# Patient Record
Sex: Male | Born: 1990 | Race: White | Hispanic: No | Marital: Married | Smoking: Current every day smoker
Health system: Southern US, Community
[De-identification: ages and names within clinical notes are randomized; demographics above are authoritative.]

## PROBLEM LIST (undated history)

## (undated) DIAGNOSIS — M25559 Pain in unspecified hip: Secondary | ICD-10-CM

## (undated) DIAGNOSIS — F419 Anxiety disorder, unspecified: Secondary | ICD-10-CM

## (undated) DIAGNOSIS — G43909 Migraine, unspecified, not intractable, without status migrainosus: Secondary | ICD-10-CM

## (undated) DIAGNOSIS — K219 Gastro-esophageal reflux disease without esophagitis: Secondary | ICD-10-CM

## (undated) DIAGNOSIS — M25551 Pain in right hip: Secondary | ICD-10-CM

## (undated) DIAGNOSIS — M1990 Unspecified osteoarthritis, unspecified site: Secondary | ICD-10-CM

## (undated) DIAGNOSIS — G4700 Insomnia, unspecified: Secondary | ICD-10-CM

## (undated) DIAGNOSIS — F32A Depression, unspecified: Secondary | ICD-10-CM

## (undated) DIAGNOSIS — I10 Essential (primary) hypertension: Secondary | ICD-10-CM

## (undated) DIAGNOSIS — F329 Major depressive disorder, single episode, unspecified: Secondary | ICD-10-CM

## (undated) DIAGNOSIS — G8929 Other chronic pain: Secondary | ICD-10-CM

## (undated) HISTORY — DX: Pain in unspecified hip: M25.559

## (undated) HISTORY — DX: Pain in right hip: M25.551

## (undated) HISTORY — DX: Depression, unspecified: F32.A

## (undated) HISTORY — DX: Other chronic pain: G89.29

## (undated) HISTORY — DX: Major depressive disorder, single episode, unspecified: F32.9

---
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
## 2010-02-19 ENCOUNTER — Emergency Department: Payer: Self-pay | Admitting: Emergency Medicine

## 2011-02-19 ENCOUNTER — Emergency Department: Payer: Self-pay | Admitting: *Deleted

## 2011-09-12 ENCOUNTER — Emergency Department: Payer: Self-pay | Admitting: Emergency Medicine

## 2011-09-12 LAB — URINALYSIS, COMPLETE
Bacteria: NONE SEEN
Blood: NEGATIVE
Glucose,UR: NEGATIVE mg/dL (ref 0–75)
Ketone: NEGATIVE
Leukocyte Esterase: NEGATIVE
Nitrite: NEGATIVE
Protein: NEGATIVE
Squamous Epithelial: <1
WBC UR: 3 /HPF (ref 0–5)

## 2013-10-25 ENCOUNTER — Emergency Department: Payer: Self-pay | Admitting: Emergency Medicine

## 2014-12-06 ENCOUNTER — Emergency Department
Admission: EM | Admit: 2014-12-06 | Discharge: 2014-12-06 | Disposition: A | Discharge status: Other Acute Inpatient Hospital | Payer: Self-pay

## 2014-12-06 ENCOUNTER — Encounter: Payer: Self-pay | Admitting: Emergency Medicine

## 2014-12-06 NOTE — ED Notes (Signed)
Pt states he has been suffering from insomnia for the past 6 months and recently has developed a migraine that has lasted about a week. Taken over the counter medications without relief. Pt is alert and talkative at this time with no increased work of breathing or acute distress noted. Pt states he has gone to see a psychiatrist who prescribed him Ambien and it did not help him sleep.

## 2015-06-06 ENCOUNTER — Emergency Department
Admission: EM | Admit: 2015-06-06 | Discharge: 2015-06-06 | Disposition: A | Discharge status: Home / Self Care | Payer: Medicaid Other | Attending: Emergency Medicine | Admitting: Emergency Medicine

## 2015-06-06 ENCOUNTER — Encounter: Payer: Self-pay | Admitting: Urgent Care

## 2015-06-06 DIAGNOSIS — I10 Essential (primary) hypertension: Secondary | ICD-10-CM | POA: Insufficient documentation

## 2015-06-06 DIAGNOSIS — F17200 Nicotine dependence, unspecified, uncomplicated: Secondary | ICD-10-CM | POA: Diagnosis not present

## 2015-06-06 DIAGNOSIS — G43909 Migraine, unspecified, not intractable, without status migrainosus: Secondary | ICD-10-CM | POA: Diagnosis not present

## 2015-06-06 HISTORY — DX: Essential (primary) hypertension: I10

## 2015-06-06 HISTORY — DX: Unspecified osteoarthritis, unspecified site: M19.90

## 2015-06-06 HISTORY — DX: Migraine, unspecified, not intractable, without status migrainosus: G43.909

## 2015-06-06 NOTE — ED Notes (Signed)
Patient presents with c/o a migraine headache x 1.5 week. Patient denies N/V, visual changes, and neck pain. Patient reports that he has been taking IBU  and Percocet 10/325mg  tabs since onset.

## 2015-06-06 NOTE — ED Notes (Signed)
No answer when called from lobby 

## 2015-06-06 NOTE — ED Notes (Signed)
No answer when called several times from lobby, outside, and restroom 

## 2016-06-15 ENCOUNTER — Encounter: Payer: Self-pay | Admitting: Emergency Medicine

## 2016-06-15 ENCOUNTER — Emergency Department
Admission: EM | Admit: 2016-06-15 | Discharge: 2016-06-15 | Disposition: A | Discharge status: Home / Self Care | Payer: Medicaid Other | Attending: Emergency Medicine | Admitting: Emergency Medicine

## 2016-06-15 DIAGNOSIS — H5711 Ocular pain, right eye: Secondary | ICD-10-CM | POA: Diagnosis not present

## 2016-06-15 DIAGNOSIS — I10 Essential (primary) hypertension: Secondary | ICD-10-CM | POA: Insufficient documentation

## 2016-06-15 DIAGNOSIS — R6884 Jaw pain: Secondary | ICD-10-CM | POA: Insufficient documentation

## 2016-06-15 DIAGNOSIS — Z5321 Procedure and treatment not carried out due to patient leaving prior to being seen by health care provider: Secondary | ICD-10-CM | POA: Insufficient documentation

## 2016-06-15 DIAGNOSIS — F17210 Nicotine dependence, cigarettes, uncomplicated: Secondary | ICD-10-CM | POA: Diagnosis not present

## 2016-06-15 HISTORY — DX: Insomnia, unspecified: G47.00

## 2016-06-15 HISTORY — DX: Anxiety disorder, unspecified: F41.9

## 2016-06-15 HISTORY — DX: Gastro-esophageal reflux disease without esophagitis: K21.9

## 2016-06-15 NOTE — ED Triage Notes (Addendum)
Pt says about 6 months ago he was seen at a [facility] for pain behind his right eye radiating down into his right jaw; says he was diagnosed with sinus infection and prescribed antibiotics; took as directed; after 6 weeks pain went away; 4 days ago same pain has returned; pt says he's been just "trying to deal with it" for the last 4 days but here now because his wife made him come; "I fought tooth and nail and finally just said screw it"; pt in no acute distress; talking in complete coherent sentences; did not take any medication for pain because "it ain't gonna help"

## 2016-11-10 ENCOUNTER — Ambulatory Visit: Payer: Medicaid Other | Admitting: Family Medicine

## 2016-11-26 ENCOUNTER — Emergency Department: Payer: Medicaid Other

## 2016-11-26 ENCOUNTER — Emergency Department
Admission: EM | Admit: 2016-11-26 | Discharge: 2016-11-26 | Disposition: A | Discharge status: Home / Self Care | Payer: Medicaid Other | Attending: Emergency Medicine | Admitting: Emergency Medicine

## 2016-11-26 DIAGNOSIS — M5410 Radiculopathy, site unspecified: Secondary | ICD-10-CM

## 2016-11-26 DIAGNOSIS — F17290 Nicotine dependence, other tobacco product, uncomplicated: Secondary | ICD-10-CM | POA: Diagnosis not present

## 2016-11-26 DIAGNOSIS — M5431 Sciatica, right side: Secondary | ICD-10-CM | POA: Diagnosis not present

## 2016-11-26 DIAGNOSIS — I10 Essential (primary) hypertension: Secondary | ICD-10-CM | POA: Insufficient documentation

## 2016-11-26 DIAGNOSIS — M545 Low back pain: Secondary | ICD-10-CM | POA: Diagnosis present

## 2016-11-26 DIAGNOSIS — Z79899 Other long term (current) drug therapy: Secondary | ICD-10-CM | POA: Diagnosis not present

## 2016-11-26 DIAGNOSIS — M549 Dorsalgia, unspecified: Secondary | ICD-10-CM

## 2016-11-26 LAB — COMPREHENSIVE METABOLIC PANEL
ALBUMIN: 4.8 g/dL (ref 3.5–5.0)
ALK PHOS: 72 U/L (ref 38–126)
ALT: 18 U/L (ref 17–63)
ANION GAP: 8 (ref 5–15)
AST: 17 U/L (ref 15–41)
BILIRUBIN TOTAL: 0.7 mg/dL (ref 0.3–1.2)
BUN: 9 mg/dL (ref 6–20)
CALCIUM: 9.4 mg/dL (ref 8.9–10.3)
CO2: 27 mmol/L (ref 22–32)
Chloride: 105 mmol/L (ref 101–111)
Creatinine, Ser: 0.82 mg/dL (ref 0.61–1.24)
GFR calc Af Amer: >60 mL/min (ref >60)
GFR calc non Af Amer: >60 mL/min (ref >60)
GLUCOSE: 87 mg/dL (ref 65–99)
Potassium: 3.8 mmol/L (ref 3.5–5.1)
SODIUM: 140 mmol/L (ref 135–145)
Total Protein: 7.9 g/dL (ref 6.5–8.1)

## 2016-11-26 LAB — CBC WITH DIFFERENTIAL/PLATELET
BASOS PCT: 1 %
Basophils Absolute: 0.1 10*3/uL (ref 0–0.1)
EOS PCT: 2 %
Eosinophils Absolute: 0.1 10*3/uL (ref 0–0.7)
HEMATOCRIT: 44.5 % (ref 40.0–52.0)
Hemoglobin: 15.5 g/dL (ref 13.0–18.0)
LYMPHS PCT: 41 %
Lymphs Abs: 3.2 10*3/uL (ref 1.0–3.6)
MCH: 30.3 pg (ref 26.0–34.0)
MCHC: 34.8 g/dL (ref 32.0–36.0)
MCV: 87.2 fL (ref 80.0–100.0)
MONO ABS: 0.5 10*3/uL (ref 0.2–1.0)
MONOS PCT: 6 %
NEUTROS ABS: 3.9 10*3/uL (ref 1.4–6.5)
Neutrophils Relative %: 50 %
PLATELETS: 279 10*3/uL (ref 150–440)
RBC: 5.11 MIL/uL (ref 4.40–5.90)
RDW: 13.3 % (ref 11.5–14.5)
WBC: 7.8 10*3/uL (ref 3.8–10.6)

## 2016-11-26 MED ORDER — MELOXICAM 15 MG PO TABS: 15.0000 mg | ORAL_TABLET | Freq: Every day | ORAL | 0 refills | Status: AC

## 2016-11-26 MED ORDER — KETOROLAC TROMETHAMINE 30 MG/ML IJ SOLN
30.0000 mg | Freq: Once | INTRAMUSCULAR | Status: AC
  Administered 2016-11-26: 30 mg via INTRAVENOUS
  Filled 2016-11-26: qty 1

## 2016-11-26 MED ORDER — DEXAMETHASONE SODIUM PHOSPHATE 10 MG/ML IJ SOLN
10.0000 mg | Freq: Once | INTRAMUSCULAR | Status: AC
Start: 2016-11-26 — End: 2016-11-26
  Administered 2016-11-26: 10 mg via INTRAVENOUS
  Filled 2016-11-26: qty 1

## 2016-11-26 MED ORDER — SODIUM CHLORIDE 0.9 % IV BOLUS (SEPSIS)
1000.0000 mL | Freq: Once | INTRAVENOUS | Status: AC
  Administered 2016-11-26: 1000 mL via INTRAVENOUS

## 2016-11-26 MED ORDER — MORPHINE SULFATE (PF) 4 MG/ML IV SOLN
4.0000 mg | Freq: Once | INTRAVENOUS | Status: AC
  Administered 2016-11-26: 4 mg via INTRAVENOUS
  Filled 2016-11-26: qty 1

## 2016-11-26 MED ORDER — ORPHENADRINE CITRATE 30 MG/ML IJ SOLN
60.0000 mg | Freq: Once | INTRAMUSCULAR | Status: AC
  Administered 2016-11-26: 60 mg via INTRAVENOUS
  Filled 2016-11-26: qty 2

## 2016-11-26 MED ORDER — ONDANSETRON HCL 4 MG/2ML IJ SOLN
4.0000 mg | Freq: Once | INTRAMUSCULAR | Status: AC
  Administered 2016-11-26: 4 mg via INTRAVENOUS
  Filled 2016-11-26: qty 2

## 2016-11-26 MED ORDER — METHOCARBAMOL 500 MG PO TABS: 500.0000 mg | ORAL_TABLET | Freq: Four times a day (QID) | ORAL | 0 refills | Status: AC

## 2016-11-26 NOTE — ED Notes (Signed)

## 2016-11-26 NOTE — ED Triage Notes (Signed)
Pt reports extreme back pain since yesterday, denies injury. Pt ambulatory to triage with even and steady gait.
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
## 2017-11-23 ENCOUNTER — Other Ambulatory Visit: Payer: Self-pay

## 2017-11-23 DIAGNOSIS — R599 Enlarged lymph nodes, unspecified: Secondary | ICD-10-CM | POA: Insufficient documentation

## 2017-11-23 DIAGNOSIS — Z79899 Other long term (current) drug therapy: Secondary | ICD-10-CM | POA: Diagnosis not present

## 2017-11-23 DIAGNOSIS — M542 Cervicalgia: Secondary | ICD-10-CM | POA: Diagnosis present

## 2017-11-23 DIAGNOSIS — J029 Acute pharyngitis, unspecified: Secondary | ICD-10-CM | POA: Insufficient documentation

## 2017-11-23 DIAGNOSIS — F17290 Nicotine dependence, other tobacco product, uncomplicated: Secondary | ICD-10-CM | POA: Insufficient documentation

## 2017-11-23 DIAGNOSIS — I10 Essential (primary) hypertension: Secondary | ICD-10-CM | POA: Diagnosis not present

## 2017-11-23 NOTE — ED Triage Notes (Signed)
Pt arrives to ED via POV from home with c/o left-sided neck pain. Pt reports having a "cyst" on his neck for "years" that recently starting getting painful. No c/o N/V/D, no fever, no SHOB or ABD pain.

## 2017-11-24 ENCOUNTER — Emergency Department
Admission: EM | Admit: 2017-11-24 | Discharge: 2017-11-24 | Disposition: A | Discharge status: Home / Self Care | Payer: Medicaid Other | Attending: Emergency Medicine | Admitting: Emergency Medicine

## 2017-11-24 DIAGNOSIS — J029 Acute pharyngitis, unspecified: Secondary | ICD-10-CM

## 2017-11-24 DIAGNOSIS — R599 Enlarged lymph nodes, unspecified: Secondary | ICD-10-CM

## 2017-11-24 LAB — GROUP A STREP BY PCR: Group A Strep by PCR: NOT DETECTED

## 2017-11-24 MED ORDER — AZITHROMYCIN 250 MG PO TABS: 250.0000 mg | ORAL_TABLET | Freq: Every day | ORAL | 0 refills | Status: AC

## 2017-11-24 MED ORDER — AZITHROMYCIN 500 MG PO TABS
500.0000 mg | ORAL_TABLET | Freq: Once | ORAL | Status: AC
  Administered 2017-11-24: 500 mg via ORAL
  Filled 2017-11-24: qty 1

## 2017-11-24 NOTE — ED Provider Notes (Signed)
[facility] Emergency Department Provider Note   ____________________________________________   First MD Initiated Contact with Patient 11/24/17 0102     (approximate)  I have reviewed the triage vital signs and the nursing notes.   HISTORY  Chief Complaint Neck Pain    HPI Charles Brown is a 27 y.o. male who presents to the ED from home with a chief complaint of left-sided neck pain.  Patient states he has had a "cyst" on his left upper neck for years which will swell and decrease in size.  It has gotten painful this week.  Coincidentally he is complaining of a sore throat.  Denies associated fever, chills, chest pain, shortness of breath, abdominal pain, nausea or vomiting.  Denies recent travel or trauma.   Past Medical History:  Diagnosis Date  . Anxiety   . Arthritis   . Chronic pain   . Depression   . GERD (gastroesophageal reflux disease)   . Hip pain, chronic    bilateral  . Hip pain, right   . Hypertension   . Insomnia   . Migraine     There are no active problems to display for this patient.   History reviewed. No pertinent surgical history.  Prior to Admission medications   Medication Sig Start Date End Date Taking? Authorizing Provider  alprazolam Prudy Feeler) 2 MG tablet Take 2 mg by mouth at bedtime.    [provider]  azithromycin (ZITHROMAX) 250 MG tablet Take 1 tablet (250 mg total) by mouth daily. 11/24/17   Irean Hong, MD  esomeprazole (NEXIUM) 20 MG capsule Take 20 mg by mouth daily at 12 noon.    [provider]  meloxicam (MOBIC) 15 MG tablet Take 1 tablet (15 mg total) by mouth daily. 11/26/16   Cuthriell, Delorise Royals, PA-C  methocarbamol (ROBAXIN) 500 MG tablet Take 1 tablet (500 mg total) by mouth 4 (four) times daily. 11/26/16   Cuthriell, Delorise Royals, PA-C  propranolol (INDERAL) 10 MG tablet Take 10 mg by mouth daily.    [provider]    Allergies Penicillins  Family History  Problem  Relation Age of Onset  . Hyperlipidemia Father   . Hyperlipidemia Paternal Grandmother   . Diabetes Paternal Grandmother   . Cancer Paternal Grandmother        lung    Social History Social History   Tobacco Use  . Smoking status: Current Every Day Smoker    Types: E-cigarettes  . Smokeless tobacco: Former Engineer, water Use Topics  . Alcohol use: No  . Drug use: No    Review of Systems  Constitutional: No fever/chills Eyes: No visual changes. ENT: No sore throat. Cardiovascular: Denies chest pain. Respiratory: Denies shortness of breath. Gastrointestinal: No abdominal pain.  No nausea, no vomiting.  No diarrhea.  No constipation. Genitourinary: Negative for dysuria. Musculoskeletal: Positive for painful cyst on left neck.  Negative for back pain. Skin: Negative for rash. Neurological: Negative for headaches, focal weakness or numbness.   ____________________________________________   PHYSICAL EXAM:  VITAL SIGNS: ED Triage Vitals  Enc Vitals Group     BP 11/23/17 2337 127/74     Pulse Rate 11/23/17 2337 (!) 101     Resp 11/23/17 2337 18     Temp 11/23/17 2337 98.4 F (36.9 C)     Temp Source 11/23/17 2337 Oral     SpO2 11/23/17 2337 98 %     Weight 11/23/17 2335 180 lb (81.6 kg)  Height 11/23/17 2335 5\' 11"  (1.803 m)     Head Circumference --      Peak Flow --      Pain Score 11/23/17 2335 8     Pain Loc --      Pain Edu? --      Excl. in GC? --     Constitutional: Alert and oriented. Well appearing and in no acute distress. Eyes: Conjunctivae are normal. PERRL. EOMI. Head: Atraumatic. Ears: Left TM dullness. Nose: No congestion/rhinnorhea. Mouth/Throat: Mucous membranes are moist.  Oropharynx erythematous without tonsillar swelling, exudates or peritonsillar abscess.  There is no hoarse or muffled voice.  There is no drooling. Neck: No stridor.  Tiny freely mobile nodule left upper neck within the hairline which is tender to palpation.  There is  no surrounding erythema, warmth or fluctuance. Cardiovascular: Normal rate, regular rhythm. Grossly normal heart sounds.  Good peripheral circulation. Respiratory: Normal respiratory effort.  No retractions. Lungs CTAB. Gastrointestinal: Soft and nontender. No distention. No abdominal bruits. No CVA tenderness. Musculoskeletal: No lower extremity tenderness nor edema.  No joint effusions. Neurologic:  Normal speech and language. No gross focal neurologic deficits are appreciated. No gait instability. Skin:  Skin is warm, dry and intact. No rash noted. Psychiatric: Mood and affect are normal. Speech and behavior are normal.  ____________________________________________   LABS (all labs ordered are listed, but only abnormal results are displayed)  Labs Reviewed  GROUP A STREP BY PCR   ____________________________________________  EKG  None ____________________________________________  RADIOLOGY  ED MD interpretation: None  Official radiology report(s): No results found.  ____________________________________________   PROCEDURES  Procedure(s) performed: None  Procedures  Critical Care performed: No  ____________________________________________   INITIAL IMPRESSION / ASSESSMENT AND PLAN / ED COURSE  As part of my medical decision making, I reviewed the following data within the electronic MEDICAL RECORD NUMBER History obtained from family, Nursing notes reviewed and incorporated, Labs reviewed and Notes from prior ED visits   27 year old male who presents with painful knot on his left neck which he has had for years.  Clinically areas consistent with enlarged occipital lymph node.  Alternatively could also be a lipoma although this is less likely. Does not appear to be sebaceous cyst or abscess.  Given patient presents with erythematous sore throat, will obtain rapid strep swab.  Clinical Course as of Nov 25 242  Tue Nov 24, 2017  0242 Updated patient and family member  of negative rapid strep result.  Patient has a penicillin allergy; will treat with azithromycin.  Strict return precautions given.  Patient verbalizes understanding and agrees with plan of care.   [JS]    Clinical Course User Index [JS] Irean HongSung, Hilmar Moldovan J, MD     ____________________________________________   FINAL CLINICAL IMPRESSION(S) / ED DIAGNOSES  Final diagnoses:  Pharyngitis, unspecified etiology  Lymph node enlargement     ED Discharge Orders        Ordered    azithromycin (ZITHROMAX) 250 MG tablet  Daily     11/24/17 0243       Note:  This document was prepared using Dragon voice recognition software and may include unintentional dictation errors.    Irean HongSung, Angela Platner J, MD 11/24/17 [phone]

## 2017-11-24 NOTE — ED Notes (Signed)
Pt states that he has had a cyst on the back left side of his neck for many years, but lately has gotten increasingly larger and more painful. Pt is worried about the cyst affecting spine and pain control.

## 2017-11-24 NOTE — Discharge Instructions (Addendum)
1.  Finish antibiotic as prescribed (Azithromycin 250 mg daily x4 days).  Take the next dose Wednesday morning. 2.  Return to the ER for worsening symptoms, persistent vomiting, difficulty breathing or other concerns.

## 2018-11-15 ENCOUNTER — Emergency Department: Payer: Medicaid Other

## 2018-11-15 ENCOUNTER — Emergency Department
Admission: EM | Admit: 2018-11-15 | Discharge: 2018-11-15 | Disposition: A | Discharge status: Home / Self Care | Payer: Medicaid Other | Attending: Emergency Medicine | Admitting: Emergency Medicine

## 2018-11-15 ENCOUNTER — Other Ambulatory Visit: Payer: Self-pay

## 2018-11-15 DIAGNOSIS — Z79899 Other long term (current) drug therapy: Secondary | ICD-10-CM | POA: Insufficient documentation

## 2018-11-15 DIAGNOSIS — I10 Essential (primary) hypertension: Secondary | ICD-10-CM | POA: Diagnosis not present

## 2018-11-15 DIAGNOSIS — F17290 Nicotine dependence, other tobacco product, uncomplicated: Secondary | ICD-10-CM | POA: Insufficient documentation

## 2018-11-15 DIAGNOSIS — R51 Headache: Secondary | ICD-10-CM | POA: Insufficient documentation

## 2018-11-15 DIAGNOSIS — T679XXA Effect of heat and light, unspecified, initial encounter: Secondary | ICD-10-CM | POA: Insufficient documentation

## 2018-11-15 DIAGNOSIS — R55 Syncope and collapse: Secondary | ICD-10-CM | POA: Diagnosis present

## 2018-11-15 DIAGNOSIS — R519 Headache, unspecified: Secondary | ICD-10-CM

## 2018-11-15 LAB — BASIC METABOLIC PANEL
Anion gap: 7 (ref 5–15)
BUN: 14 mg/dL (ref 6–20)
CO2: 23 mmol/L (ref 22–32)
Calcium: 9.3 mg/dL (ref 8.9–10.3)
Chloride: 109 mmol/L (ref 98–111)
Creatinine, Ser: 0.7 mg/dL (ref 0.61–1.24)
GFR calc Af Amer: >60 mL/min (ref >60)
GFR calc non Af Amer: >60 mL/min (ref >60)
Glucose, Bld: 98 mg/dL (ref 70–99)
Potassium: 3.8 mmol/L (ref 3.5–5.1)
Sodium: 139 mmol/L (ref 135–145)

## 2018-11-15 LAB — URINALYSIS, COMPLETE (UACMP) WITH MICROSCOPIC
Bacteria, UA: NONE SEEN
Bilirubin Urine: NEGATIVE
Glucose, UA: NEGATIVE mg/dL
Hgb urine dipstick: NEGATIVE
Ketones, ur: NEGATIVE mg/dL
Leukocytes,Ua: NEGATIVE
Nitrite: NEGATIVE
Protein, ur: NEGATIVE mg/dL
Specific Gravity, Urine: 1.006 (ref 1.005–1.030)
Squamous Epithelial / LPF: NONE SEEN (ref 0–5)
pH: 5 (ref 5.0–8.0)

## 2018-11-15 LAB — CBC
HCT: 42.9 % (ref 39.0–52.0)
Hemoglobin: 14.5 g/dL (ref 13.0–17.0)
MCH: 30.5 pg (ref 26.0–34.0)
MCHC: 33.8 g/dL (ref 30.0–36.0)
MCV: 90.1 fL (ref 80.0–100.0)
Platelets: 276 10*3/uL (ref 150–400)
RBC: 4.76 MIL/uL (ref 4.22–5.81)
RDW: 12.3 % (ref 11.5–15.5)
WBC: 8.1 10*3/uL (ref 4.0–10.5)
nRBC: 0 % (ref 0.0–0.2)

## 2018-11-15 LAB — GLUCOSE, CAPILLARY: Glucose-Capillary: 100 mg/dL — ABNORMAL HIGH (ref 70–99)

## 2018-11-15 MED ORDER — KETOROLAC TROMETHAMINE 30 MG/ML IJ SOLN
30.0000 mg | Freq: Once | INTRAMUSCULAR | Status: AC
  Administered 2018-11-15: 30 mg via INTRAVENOUS
  Filled 2018-11-15: qty 1

## 2018-11-15 MED ORDER — BUTALBITAL-APAP-CAFFEINE 50-325-40 MG PO TABS: 1.0000 | ORAL_TABLET | Freq: Four times a day (QID) | ORAL | 0 refills | Status: AC | PRN

## 2018-11-15 MED ORDER — SODIUM CHLORIDE 0.9 % IV BOLUS
1000.0000 mL | Freq: Once | INTRAVENOUS | Status: AC
  Administered 2018-11-15: 14:00:00 1000 mL via INTRAVENOUS

## 2018-11-15 MED ORDER — SODIUM CHLORIDE 0.9% FLUSH
3.0000 mL | Freq: Once | INTRAVENOUS | Status: AC
  Administered 2018-11-15: 3 mL via INTRAVENOUS

## 2018-11-15 NOTE — ED Notes (Signed)
Patient transported to CT 

## 2018-11-15 NOTE — ED Triage Notes (Signed)
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 Pt is a/ox4 on arrival, ambulates with a steady gate.

## 2018-11-15 NOTE — Discharge Instructions (Signed)
Follow-up with [facility] no clinic acute care or an urgent care if any continued problems.  Drink plenty of fluids especially Gatorade or similar especially if you are out working outside.  Read information about heat exhaustion.  Take Fioricet if needed for headache.  Do not drive or operate machinery while taking this medication.

## 2018-11-15 NOTE — ED Provider Notes (Signed)
[facility] Emergency Department Provider Note  ___________________________________________   First MD Initiated Contact with Patient 11/15/18 1306     (approximate)  I have reviewed the triage vital signs and the nursing notes.   HISTORY  Chief Complaint Near Syncope   HPI Charles Brown is a 28 y.o. male presents to the ED with complaint of frontal headache that proceeds to posterior area.  Patient states he woke up this way and took ibuprofen and went to work.  Patient works outside in the heat but has been drinking water and Gatorade.  He states that at one point while lifting something he had to sit down because of dizziness with some nausea.  He states he "feels dehydrated".  He reports a history of migraines however this headache is worse.  He is never had a CT scan.  He rates his pain as 7 out of 10.      Past Medical History:  Diagnosis Date  . Anxiety   . Arthritis   . Chronic pain   . Depression   . GERD (gastroesophageal reflux disease)   . Hip pain, chronic    bilateral  . Hip pain, right   . Hypertension   . Insomnia   . Migraine     There are no active problems to display for this patient.   History reviewed. No pertinent surgical history.  Prior to Admission medications   Medication Sig Start Date End Date Taking? Authorizing Provider  alprazolam Prudy Feeler(XANAX) 2 MG tablet Take 2 mg by mouth at bedtime.    [provider]  azithromycin (ZITHROMAX) 250 MG tablet Take 1 tablet (250 mg total) by mouth daily. 11/24/17   Irean HongSung, Jade J, MD  butalbital-acetaminophen-caffeine (FIORICET) [phone] MG tablet Take 1 tablet by mouth every 6 (six) hours as needed for headache. 11/15/18 11/15/19  Tommi RumpsSummers, Rhonda L, PA-C  esomeprazole (NEXIUM) 20 MG capsule Take 20 mg by mouth daily at 12 noon.    [provider]  meloxicam (MOBIC) 15 MG tablet Take 1 tablet (15 mg total) by mouth daily. 11/26/16   Cuthriell, Delorise RoyalsJonathan D, PA-C   methocarbamol (ROBAXIN) 500 MG tablet Take 1 tablet (500 mg total) by mouth 4 (four) times daily. 11/26/16   Cuthriell, Delorise RoyalsJonathan D, PA-C  propranolol (INDERAL) 10 MG tablet Take 10 mg by mouth daily.    [provider]    Allergies Penicillins  Family History  Problem Relation Age of Onset  . Hyperlipidemia Father   . Hyperlipidemia Paternal Grandmother   . Diabetes Paternal Grandmother   . Cancer Paternal Grandmother        lung    Social History Social History   Tobacco Use  . Smoking status: Current Every Day Smoker    Types: E-cigarettes  . Smokeless tobacco: Former Engineer, waterUser  Substance Use Topics  . Alcohol use: No  . Drug use: No    Review of Systems Constitutional: No fever/chills Eyes: No visual changes. ENT: No sore throat. Cardiovascular: Denies chest pain. Respiratory: Denies shortness of breath. Gastrointestinal: No abdominal pain.  No nausea, no vomiting.  No diarrhea.  Genitourinary: Negative for dysuria. Musculoskeletal: Negative for muscle aches. Skin: Negative for rash. Neurological: Positive for headaches, negative for focal weakness or numbness. ____________________________________________   PHYSICAL EXAM:  VITAL SIGNS: ED Triage Vitals  Enc Vitals Group     BP 11/15/18 1221 (!) 145/88     Pulse Rate 11/15/18 1221 95     Resp 11/15/18 1221  17     Temp 11/15/18 1221 98.4 F (36.9 C)     Temp Source 11/15/18 1221 Oral     SpO2 11/15/18 1221 98 %     Weight 11/15/18 1222 165 lb (74.8 kg)     Height 11/15/18 1222 5\' 11"  (1.803 m)     Head Circumference --      Peak Flow --      Pain Score 11/15/18 1222 7     Pain Loc --      Pain Edu? --      Excl. in GC? --    Constitutional: Alert and oriented. Well appearing and in no acute distress. Eyes: Conjunctivae are normal. PERRL. EOMI. Head: Atraumatic. Nose: No congestion/rhinnorhea. Mouth/Throat: Mucous membranes are moist.  Oropharynx non-erythematous. Neck: No stridor.    Hematological/Lymphatic/Immunilogical: No cervical lymphadenopathy. Cardiovascular: Normal rate, regular rhythm. Grossly normal heart sounds.  Good peripheral circulation. Respiratory: Normal respiratory effort.  No retractions. Lungs CTAB. Gastrointestinal: Soft and nontender. No distention.  Musculoskeletal: Moves upper lower extremities without any difficulty.  Normal gait was noted. Neurologic:  Normal speech and language. No gross focal neurologic deficits are appreciated. No gait instability. Skin:  Skin is warm, dry and intact. No rash noted. Psychiatric: Mood and affect are normal. Speech and behavior are normal.  ____________________________________________   LABS (all labs ordered are listed, but only abnormal results are displayed)  Labs Reviewed  URINALYSIS, COMPLETE (UACMP) WITH MICROSCOPIC - Abnormal; Notable for the following components:      Result Value   Color, Urine STRAW (*)    APPearance CLEAR (*)    All other components within normal limits  GLUCOSE, CAPILLARY - Abnormal; Notable for the following components:   Glucose-Capillary 100 (*)    All other components within normal limits  BASIC METABOLIC PANEL  CBC  CBG MONITORING, ED   ____________________________________________  EKG EKG was reviewed by Dr. Scotty CourtStafford.  Normal sinus rhythm with ventricular rate of 85, PR interval 130, QRS duration 92.  ____________________________________________  RADIOLOGY   Official radiology report(s): Ct Head Wo Contrast  Result Date: 11/15/2018 CLINICAL DATA:  28 year old who awoke with a headache this morning which steadily worsen while outside at work, associated with dizziness and nausea, described as the worst headache he has ever had. EXAM: CT HEAD WITHOUT CONTRAST TECHNIQUE: Contiguous axial images were obtained from the base of the skull through the vertex without intravenous contrast. COMPARISON:  None. FINDINGS: Brain: Ventricular system normal in size and  appearance for age. No mass lesion. No midline shift. No acute hemorrhage or hematoma. No extra-axial fluid collections. No evidence of acute infarction. No focal brain parenchymal abnormalities. Vascular: No hyperdense vessel. No visible atherosclerosis. Skull: No skull fracture or other focal osseous abnormality involving the skull. Sinuses/Orbits: Visualized paranasal sinuses, bilateral mastoid air cells and bilateral middle ear cavities well-aerated. Visualized orbits and globes normal in appearance. Other: None. IMPRESSION: Normal examination. Electronically Signed   By: Hulan Saashomas  Lawrence M.D.   On: 11/15/2018 13:38    ____________________________________________   PROCEDURES  Procedure(s) performed (including Critical Care):  Procedures   ____________________________________________   INITIAL IMPRESSION / ASSESSMENT AND PLAN / ED COURSE  As part of my medical decision making, I reviewed the following data within the electronic MEDICAL RECORD NUMBER Notes from prior ED visits and [place] Controlled Substance Database  28 year old male presents to the ED with complaint of headache along with a near syncopal episode at work today.  Patient works outside and states that  he was trying to drink plenty of fluids but with bending over and lifting things he "felt dizzy" and did not actually have a syncopal episode but felt like he was near it.  He denies any nausea, vomiting or visual changes.  Patient states that his headache is frontal and posterior and is worse with bending forward.  Physical exam is unremarkable.  CT scan was negative.  Patient was given a liter of fluids along with Toradol IV which helped with his headache and he was improved prior to discharge.  Patient was encouraged to drink fluids while at work especially Gatorade or similar drinks.  He was given a prescription for Fioricet as needed for headache.  He is to follow-up with [facility] clinic or any acute care if any continued problems.   ____________________________________________   FINAL CLINICAL IMPRESSION(S) / ED DIAGNOSES  Final diagnoses:  Acute nonintractable headache, unspecified headache type  Heat exposure, initial encounter     ED Discharge Orders         Ordered    butalbital-acetaminophen-caffeine (FIORICET) 50-325-40 MG tablet  Every 6 hours PRN     11/15/18 1504           Note:  This document was prepared using Dragon voice recognition software and may include unintentional dictation errors.    Johnn Hai, PA-C 11/15/18 1513    Delman Kitten, MD 11/15/18 2122

## 2018-11-15 NOTE — ED Notes (Signed)
See triage note. Pt in for "migrane" headache and near syncopal episode at work today, Pt states he felt dizzy and almost fell but a co-worker helped him sit down. Pt denies LoC. Upon assessment, pt A&Ox4, NAD. No respiratory sx noted.

## 2018-11-15 NOTE — ED Triage Notes (Signed)
First Nurse NOte:  C/O migraine headache and feeling 'dehydrated".  AAOx3.  Skin warm and dry.  MAE equally and strong.  NAD

## 2019-04-08 ENCOUNTER — Other Ambulatory Visit: Payer: Self-pay

## 2019-04-08 DIAGNOSIS — Z20822 Contact with and (suspected) exposure to covid-19: Secondary | ICD-10-CM

## 2019-04-09 LAB — NOVEL CORONAVIRUS, NAA: SARS-CoV-2, NAA: NOT DETECTED

## 2020-01-12 ENCOUNTER — Emergency Department (HOSPITAL_COMMUNITY)
Admission: EM | Admit: 2020-01-12 | Discharge: 2020-01-12 | Disposition: A | Discharge status: Home / Self Care | Payer: Medicaid Other | Attending: Emergency Medicine | Admitting: Emergency Medicine

## 2020-01-12 ENCOUNTER — Encounter (HOSPITAL_COMMUNITY): Payer: Self-pay | Admitting: Emergency Medicine

## 2020-01-12 ENCOUNTER — Other Ambulatory Visit: Payer: Self-pay

## 2020-01-12 DIAGNOSIS — R0981 Nasal congestion: Secondary | ICD-10-CM | POA: Insufficient documentation

## 2020-01-12 DIAGNOSIS — I10 Essential (primary) hypertension: Secondary | ICD-10-CM | POA: Diagnosis not present

## 2020-01-12 DIAGNOSIS — Z20822 Contact with and (suspected) exposure to covid-19: Secondary | ICD-10-CM | POA: Insufficient documentation

## 2020-01-12 DIAGNOSIS — F17290 Nicotine dependence, other tobacco product, uncomplicated: Secondary | ICD-10-CM | POA: Diagnosis not present

## 2020-01-12 DIAGNOSIS — Z79899 Other long term (current) drug therapy: Secondary | ICD-10-CM | POA: Diagnosis not present

## 2020-01-12 DIAGNOSIS — R0982 Postnasal drip: Secondary | ICD-10-CM | POA: Diagnosis not present

## 2020-01-12 LAB — SARS CORONAVIRUS 2 BY RT PCR (HOSPITAL ORDER, PERFORMED IN ~~LOC~~ HOSPITAL LAB): SARS Coronavirus 2: NEGATIVE

## 2020-01-12 NOTE — Discharge Instructions (Signed)
You likely have a viral illness.  This should be treated symptomatically. Use Tylenol or ibuprofen as needed for pain. Use nasal spray daily for nasal congestion and cough. Make sure you stay well-hydrated with water. Wash your hands frequently to prevent spread of infection. Follow-up with your primary care doctor in 1 week if your symptoms are not improving. Return to the emergency room if you develop chest pain, difficulty breathing, or any new or worsening symptoms.

## 2020-01-12 NOTE — ED Triage Notes (Signed)
Pt reports nasal congestion, sinus pressure for last several days.

## 2020-01-12 NOTE — ED Provider Notes (Signed)
[facility] EMERGENCY DEPARTMENT Provider Note   CSN: [id number] Arrival date & time: 01/12/20  1636     History Chief Complaint  Patient presents with   Nasal Congestion    Charles Brown is a 29 y.o. male presenting for evaluation of nasal congestion sinus pressure.  Patient states his symptoms began 2 nights ago.  He reports bilateral nasal congestion, worse in the left side.  He has associated sinus pressure.  When he wakes up, his throat is scratchy, but this resolves almost immediately.  Reports mild postnasal drainage.  He reports he had multiple family members with similar symptoms last weekend.  He denies fevers, chills, ear pain, chest pain, shortness breath, cough.  He has been using over-the-counter cough and cold medicine with improvement of symptoms.  He is also using Afrin with improvement.  He is here today because he needs a work note because he did not feel he could go into work today.  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  HPI     Past Medical History:  Diagnosis Date   Anxiety    Arthritis    Chronic pain    Depression    GERD (gastroesophageal reflux disease)    Hip pain, chronic    bilateral   Hip pain, right    Hypertension    Insomnia    Migraine     There are no problems to display for this patient.   History reviewed. No pertinent surgical history.     Family History  Problem Relation Age of Onset   Hyperlipidemia Father    Hyperlipidemia Paternal Grandmother    Diabetes Paternal Grandmother    Cancer Paternal Grandmother        lung    Social History   Tobacco Use   Smoking status: Current Every Day Smoker    Types: E-cigarettes   Smokeless tobacco: Former Neurosurgeon  Substance Use Topics   Alcohol use: No   Drug use: No    Home Medications Prior to Admission medications   Medication Sig Start Date End Date Taking? Authorizing Provider  alprazolam Prudy Feeler) 2 MG tablet Take 2 mg  by mouth at bedtime.    [provider]  azithromycin (ZITHROMAX) 250 MG tablet Take 1 tablet (250 mg total) by mouth daily. 11/24/17   Irean Hong, MD  esomeprazole (NEXIUM) 20 MG capsule Take 20 mg by mouth daily at 12 noon.    [provider]  meloxicam (MOBIC) 15 MG tablet Take 1 tablet (15 mg total) by mouth daily. 11/26/16   Cuthriell, Delorise Royals, PA-C  methocarbamol (ROBAXIN) 500 MG tablet Take 1 tablet (500 mg total) by mouth 4 (four) times daily. 11/26/16   Cuthriell, Delorise Royals, PA-C  propranolol (INDERAL) 10 MG tablet Take 10 mg by mouth daily.    [provider]    Allergies    Penicillins  Review of Systems   Review of Systems  Constitutional: Negative for fever.  HENT: Positive for congestion, sinus pressure and sinus pain.     Physical Exam Updated Vital Signs BP 135/88 (BP Location: Right Arm)    Pulse 74    Temp 99.3 F (37.4 C) (Oral)    Resp 18    Ht 5\' 11"  (1.803 m)    Wt 74.8 kg    SpO2 98%    BMI 23.01 kg/m   Physical Exam Vitals and nursing note reviewed.  Constitutional:      General:  He is not in acute distress.    Appearance: He is well-developed.     Comments: Sitting in the bed in no acute distress  HENT:     Head: Normocephalic and atraumatic.     Ears:     Comments: TMs nonerythematous nonbulging bilaterally.    Nose: Mucosal edema present.     Right Sinus: Maxillary sinus tenderness present.     Left Sinus: Maxillary sinus tenderness present.     Comments: Bilateral nasal mucosal edema.  Mild tenderness palpation of maxillary sinus bilaterally.    Mouth/Throat:     Comments: OP clear without tonsillar swelling or exudate.  Uvula midline with good palate rise. Cardiovascular:     Rate and Rhythm: Normal rate and regular rhythm.     Pulses: Normal pulses.  Pulmonary:     Effort: Pulmonary effort is normal.     Breath sounds: Normal breath sounds.     Comments: Clear lung sounds in all fields.  Speaking in full  sentences. Abdominal:     General: There is no distension.     Palpations: There is no mass.     Tenderness: There is no abdominal tenderness. There is no guarding or rebound.  Musculoskeletal:        General: Normal range of motion.     Cervical back: Normal range of motion.  Skin:    General: Skin is warm.     Capillary Refill: Capillary refill takes less than 2 seconds.     Findings: No rash.  Neurological:     Mental Status: He is alert and oriented to person, place, and time.     ED Results / Procedures / Treatments   Labs (all labs ordered are listed, but only abnormal results are displayed) Labs Reviewed  SARS CORONAVIRUS 2 BY RT PCR (HOSPITAL ORDER, PERFORMED IN [facility])    EKG None  Radiology No results found.  Procedures Procedures (including critical care time)  Medications Ordered in ED Medications - No data to display  ED Course  I have reviewed the triage vital signs and the nursing notes.  Pertinent labs & imaging results that were available during my care of the patient were reviewed by me and considered in my medical decision making (see chart for details).    MDM Rules/Calculators/A&P                          Patient presenting with 2 day h/o congestion and sinus pressure. Physical exam reassuring, patient is afebrile and appears nontoxic.  Pulmonary exam reassuring.  Doubt pneumonia, strep, other bacterial infection, or peritonsillar abscess. covid testing negative.  Likely viral sinusitis.  Will treat symptomatically.  Patient to follow-up with primary care as needed.  At this time, patient appears safe for discharge.  Return precautions given.  Patient states he understands and agrees to plan.  Final Clinical Impression(s) / ED Diagnoses Final diagnoses:  Nasal congestion  Post-nasal drip    Rx / DC Orders ED Discharge Orders    None       Alveria Apley, PA-C 01/12/20 Biagio Quint, MD 01/12/20  2332

## 2020-01-29 IMAGING — CT CT HEAD WITHOUT CONTRAST
3 series · 16 of 47 positions shown, 19 images · non-contrast
Comparison: None.

CLINICAL DATA: 27-year-old who awoke with a headache this morning
which steadily worsen while outside at work, associated with
dizziness and nausea, described as the worst headache he has ever
had.

EXAM:
CT HEAD WITHOUT CONTRAST
TECHNIQUE: Contiguous axial images were obtained from the base of the skull
through the vertex without intravenous contrast.

[Series 2: head wo · axial · 0.38mm/px · z∈[+439,+564]mm · 10 of 31 slices shown, 13 images]
[im 3/31  brain]
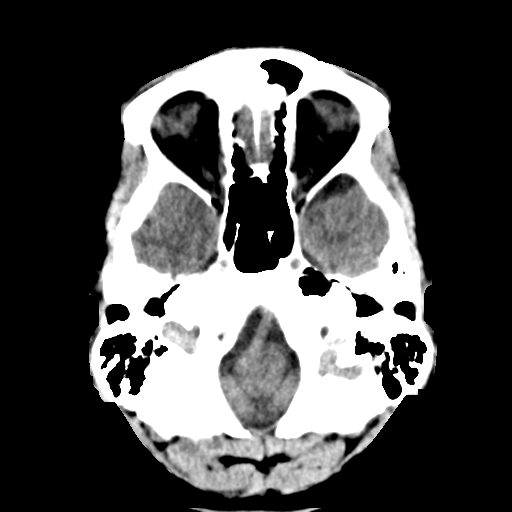
[im 3/31  bone]
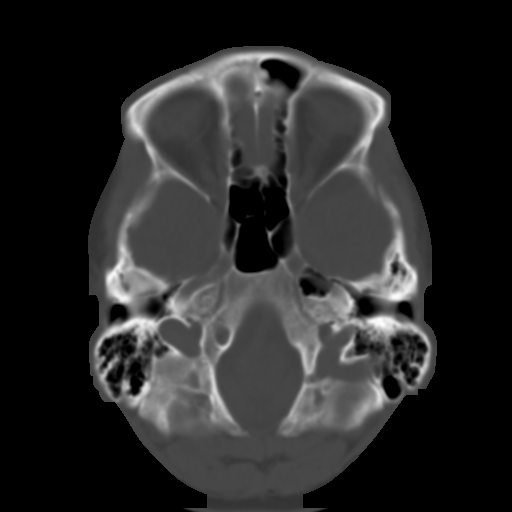
[im 6/31  brain]
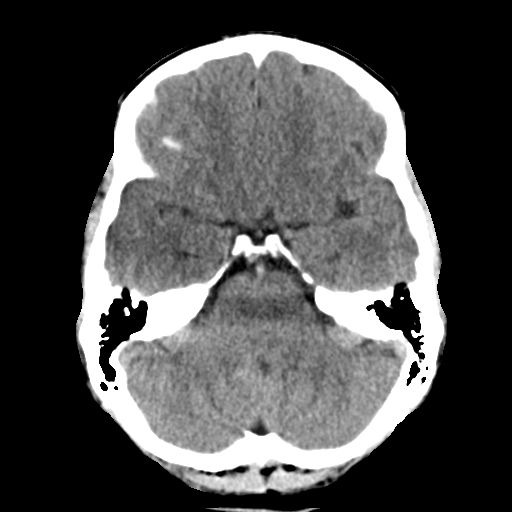
[im 9/31  brain]
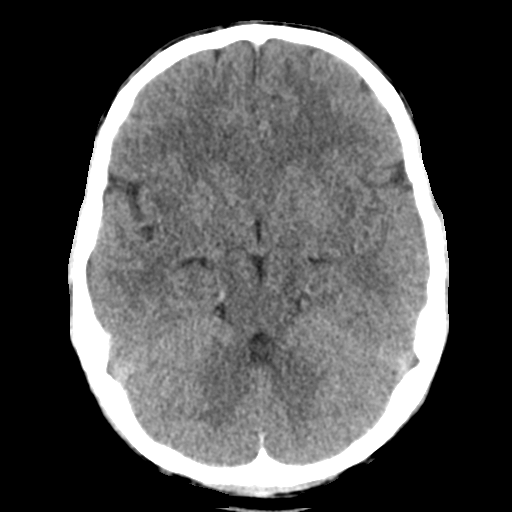
[im 11/31  brain]
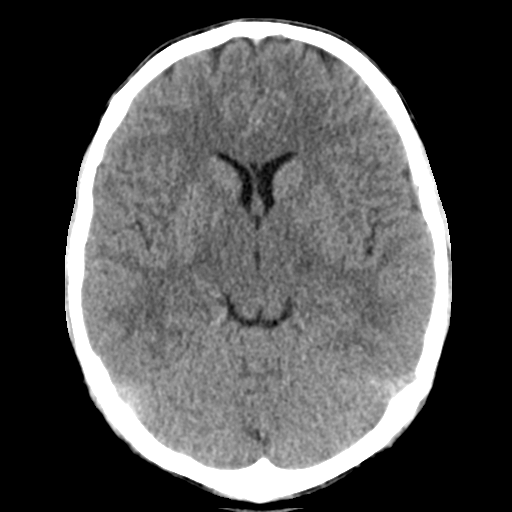
[im 14/31  brain]
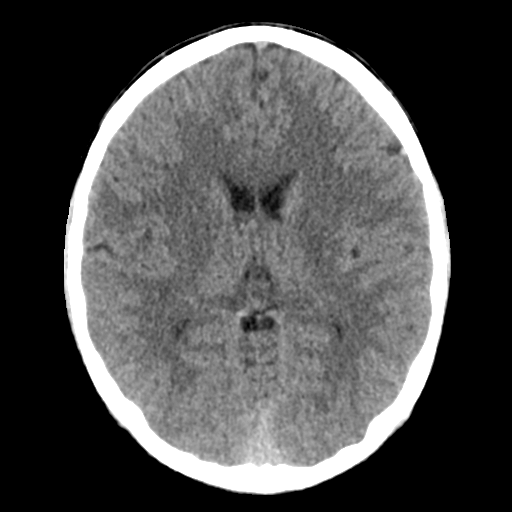
[im 14/31  bone]
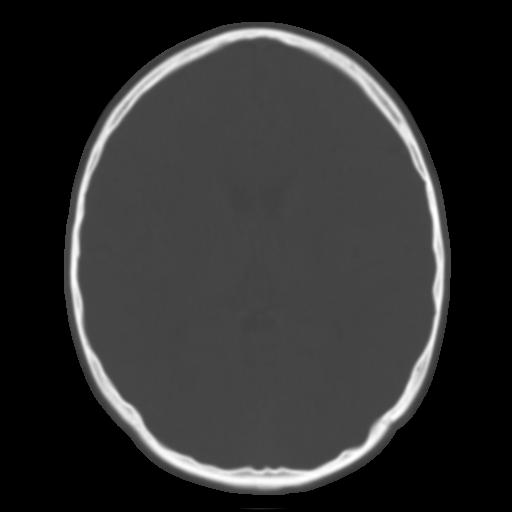
[im 17/31  brain]
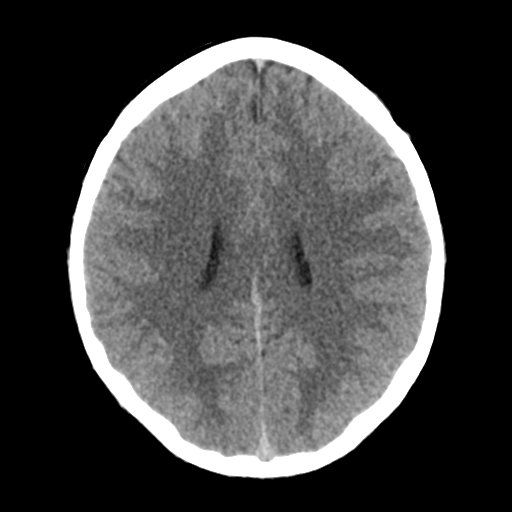
[im 20/31  brain]
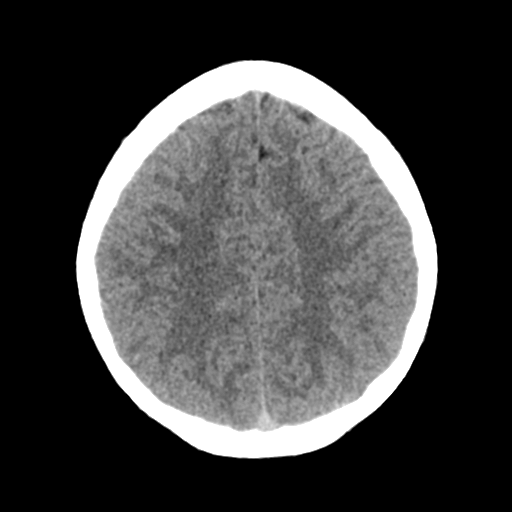
[im 23/31  brain]
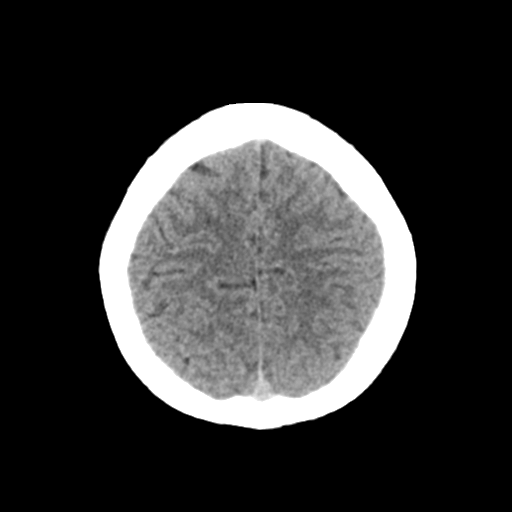
[im 25/31  brain]
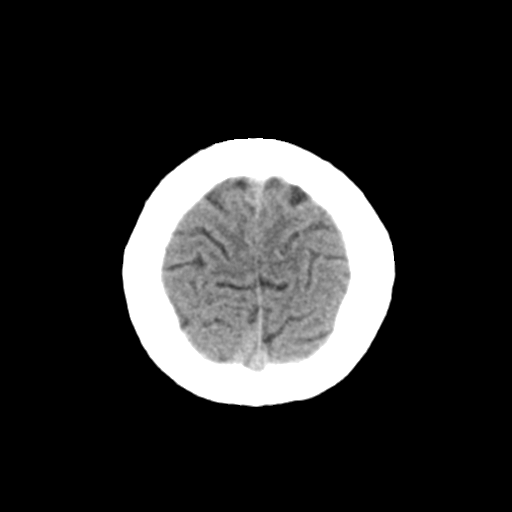
[im 25/31  bone]
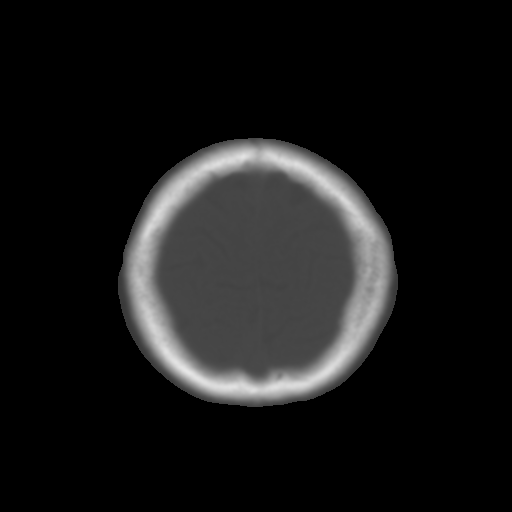
[im 28/31  brain]
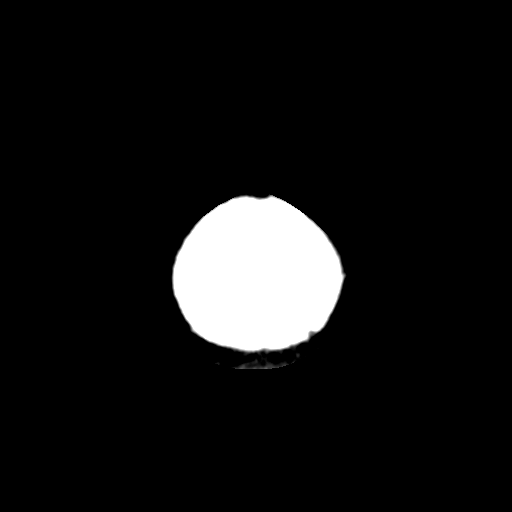

[Series 4: coronal soft tissue · coronal · 0.37mm/px · 3 of 64 slices shown]
[im 22/64  brain]
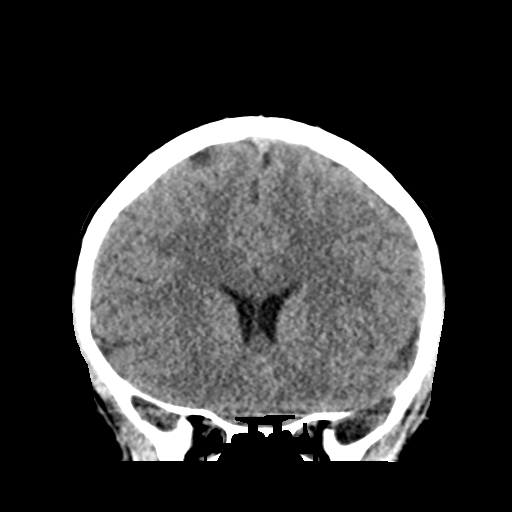
[im 29/64  brain]
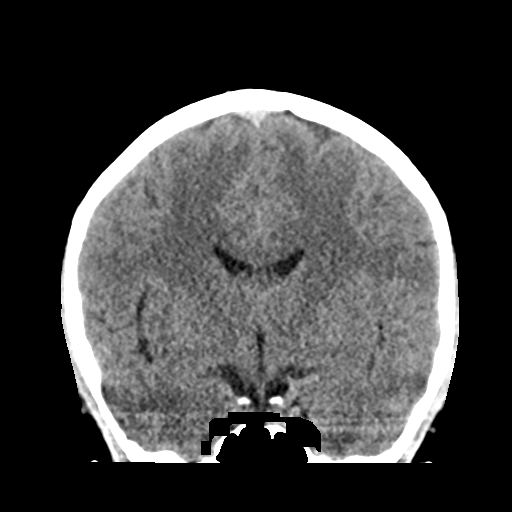
[im 36/64  brain]
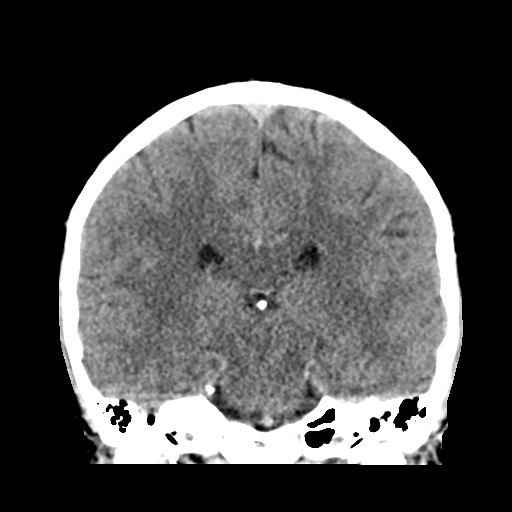

[Series 5: sagittal soft tissue · sagittal · 0.37mm/px · 3 of 54 slices shown]
[im 18/54  brain]
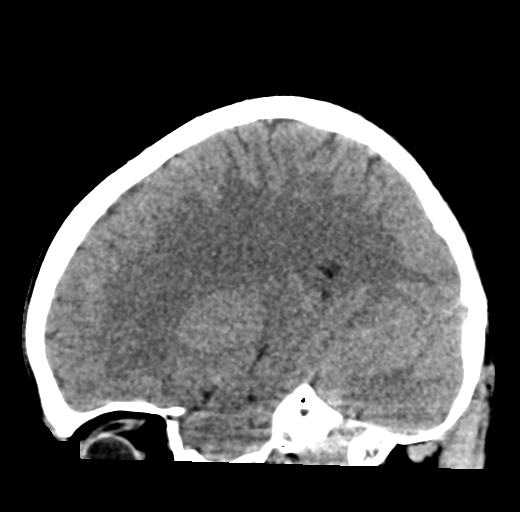
[im 27/54  brain]
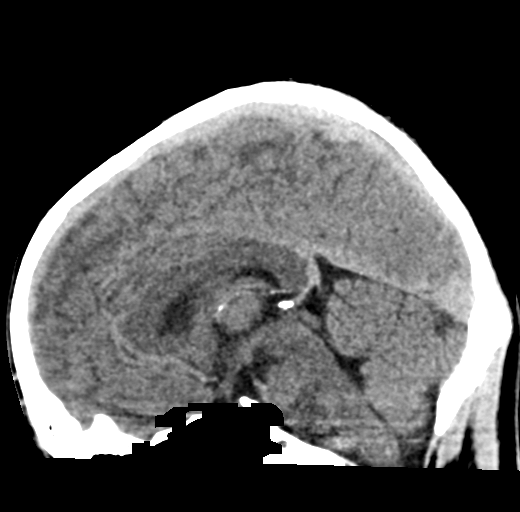
[im 36/54  brain]
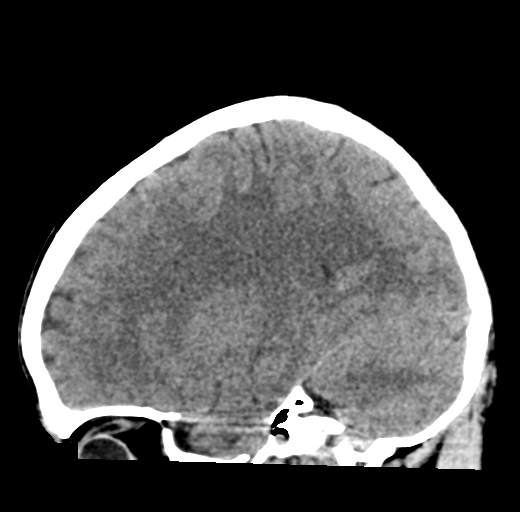

[16 of 47 positions shown; findings below may reference images not displayed]

FINDINGS: Brain: Ventricular system normal in size and appearance for age. No
mass lesion. No midline shift. No acute hemorrhage or hematoma. No
extra-axial fluid collections. No evidence of acute infarction. No
focal brain parenchymal abnormalities.

Vascular: No hyperdense vessel. No visible atherosclerosis.

Skull: No skull fracture or other focal osseous abnormality
involving the skull.

Sinuses/Orbits: Visualized paranasal sinuses, bilateral mastoid air
cells and bilateral middle ear cavities well-aerated. Visualized
orbits and globes normal in appearance.

Other: None.
IMPRESSION: Normal examination.

## 2020-12-17 ENCOUNTER — Encounter (HOSPITAL_COMMUNITY): Payer: Self-pay

## 2020-12-17 ENCOUNTER — Other Ambulatory Visit: Payer: Self-pay

## 2020-12-17 ENCOUNTER — Emergency Department (HOSPITAL_COMMUNITY)
Admission: EM | Admit: 2020-12-17 | Discharge: 2020-12-17 | Disposition: A | Discharge status: Home / Self Care | Payer: Medicaid Other | Attending: Emergency Medicine | Admitting: Emergency Medicine

## 2020-12-17 DIAGNOSIS — I10 Essential (primary) hypertension: Secondary | ICD-10-CM | POA: Diagnosis not present

## 2020-12-17 DIAGNOSIS — S80861A Insect bite (nonvenomous), right lower leg, initial encounter: Secondary | ICD-10-CM

## 2020-12-17 DIAGNOSIS — S90561A Insect bite (nonvenomous), right ankle, initial encounter: Secondary | ICD-10-CM | POA: Insufficient documentation

## 2020-12-17 DIAGNOSIS — Z23 Encounter for immunization: Secondary | ICD-10-CM | POA: Insufficient documentation

## 2020-12-17 DIAGNOSIS — F17290 Nicotine dependence, other tobacco product, uncomplicated: Secondary | ICD-10-CM | POA: Insufficient documentation

## 2020-12-17 DIAGNOSIS — W57XXXA Bitten or stung by nonvenomous insect and other nonvenomous arthropods, initial encounter: Secondary | ICD-10-CM | POA: Insufficient documentation

## 2020-12-17 DIAGNOSIS — Z79899 Other long term (current) drug therapy: Secondary | ICD-10-CM | POA: Diagnosis not present

## 2020-12-17 MED ORDER — TETANUS-DIPHTH-ACELL PERTUSSIS 5-2.5-18.5 LF-MCG/0.5 IM SUSY
0.5000 mL | PREFILLED_SYRINGE | Freq: Once | INTRAMUSCULAR | Status: AC
  Administered 2020-12-17: 0.5 mL via INTRAMUSCULAR
  Filled 2020-12-17: qty 0.5

## 2020-12-17 MED ORDER — TRIAMCINOLONE ACETONIDE 0.1 % EX CREA: 1.0000 "application " | TOPICAL_CREAM | Freq: Two times a day (BID) | CUTANEOUS | 0 refills | Status: AC

## 2020-12-17 NOTE — ED Triage Notes (Signed)
Pt. States they believe they were bit yesterday in the right ankle. Pts. Ankle is red and swollen upon assessment.

## 2020-12-17 NOTE — ED Provider Notes (Signed)
[facility] EMERGENCY DEPARTMENT Provider Note   CSN: [id number] Arrival date & time: 12/17/20  1632     History Chief Complaint  Patient presents with   Insect Bite    Charles Brown is a 30 y.o. male.  HPI  Patient with no significant medical history presents to the emergency department with chief complaint of a bug bite on his right ankle.  Patient states he noticed it this yesterday but it has increased in size and redness.  Patient denies pain but states it is very itchy to the touch, he states he has occasional paresthesias in his right foot, able to move his toes and ankle and knee without difficulty, he is not immunocompromise, does not remember the last time he had had a tetanus shot, he denies alleviating factors.  Does not endorse fevers, chills, chest pain, shortness of breath, denies IV drug use, has never had this in the past. Past Medical History:  Diagnosis Date   Anxiety    Arthritis    Chronic pain    Depression    GERD (gastroesophageal reflux disease)    Hip pain, chronic    bilateral   Hip pain, right    Hypertension    Insomnia    Migraine     There are no problems to display for this patient.   History reviewed. No pertinent surgical history.     Family History  Problem Relation Age of Onset   Hyperlipidemia Father    Hyperlipidemia Paternal Grandmother    Diabetes Paternal Grandmother    Cancer Paternal Grandmother        lung    Social History   Tobacco Use   Smoking status: Every Day    Types: E-cigarettes   Smokeless tobacco: Former  Substance Use Topics   Alcohol use: No   Drug use: No    Home Medications Prior to Admission medications   Medication Sig Start Date End Date Taking? Authorizing Provider  triamcinolone cream (KENALOG) 0.1 % Apply 1 application topically 2 (two) times daily for 7 days. 12/17/20 12/24/20 Yes Carroll Sage, PA-C  alprazolam Prudy Feeler) 2 MG tablet Take 2 mg by mouth at bedtime.    [provider]  azithromycin (ZITHROMAX) 250 MG tablet Take 1 tablet (250 mg total) by mouth daily. 11/24/17   Irean Hong, MD  esomeprazole (NEXIUM) 20 MG capsule Take 20 mg by mouth daily at 12 noon.    [provider]  meloxicam (MOBIC) 15 MG tablet Take 1 tablet (15 mg total) by mouth daily. 11/26/16   Cuthriell, Delorise Royals, PA-C  methocarbamol (ROBAXIN) 500 MG tablet Take 1 tablet (500 mg total) by mouth 4 (four) times daily. 11/26/16   Cuthriell, Delorise Royals, PA-C  propranolol (INDERAL) 10 MG tablet Take 10 mg by mouth daily.    [provider]    Allergies    Penicillins  Review of Systems   Review of Systems  Constitutional:  Negative for chills and fever.  HENT:  Negative for congestion.   Respiratory:  Negative for shortness of breath.   Cardiovascular:  Negative for chest pain.  Gastrointestinal:  Negative for abdominal pain.  Genitourinary:  Negative for enuresis.  Musculoskeletal:  Negative for back pain.  Skin:  Positive for rash and wound.  Neurological:  Negative for dizziness.  Hematological:  Does not bruise/bleed easily.   Physical Exam Updated Vital Signs BP (!) 135/102 (BP Location: Right Arm)   Pulse 76   Temp  98.3 F (36.8 C) (Oral)   Resp 18   Ht 5\' 11"  (1.803 m)   Wt 79.4 kg   SpO2 100%   BMI 24.41 kg/m   Physical Exam Vitals and nursing note reviewed.  Constitutional:      General: He is not in acute distress.    Appearance: He is not ill-appearing.  HENT:     Head: Normocephalic and atraumatic.     Nose: No congestion.  Eyes:     Conjunctiva/sclera: Conjunctivae normal.  Cardiovascular:     Rate and Rhythm: Normal rate and regular rhythm.     Pulses: Normal pulses.     Heart sounds: No murmur heard.   No friction rub. No gallop.  Pulmonary:     Effort: No respiratory distress.     Breath sounds: No wheezing, rhonchi or rales.  Musculoskeletal:     Comments: Patient has full range of motion in his lower extremities,  neurovascular fully intact.  Patient spine was palpated was nontender to palpation.  Skin:    General: Skin is warm and dry.     Comments: Patient's right lower extremity was visualized there is no unilateral leg swelling, he has slight edema noted in the lateral aspect of his lateral malleolus, there is a small papule, no drainage or discharge present, area was slightly warm to the touch but no fluctuance or indurations noted.  No scaling of the skin, blanches with pressure.  Neurological:     Mental Status: He is alert.  Psychiatric:        Mood and Affect: Mood normal.    ED Results / Procedures / Treatments   Labs (all labs ordered are listed, but only abnormal results are displayed) Labs Reviewed - No data to display  EKG None  Radiology No results found.  Procedures Procedures   Medications Ordered in ED Medications  Tdap (BOOSTRIX) injection 0.5 mL (0.5 mLs Intramuscular Given 12/17/20 1752)    ED Course  I have reviewed the triage vital signs and the nursing notes.  Pertinent labs & imaging results that were available during my care of the patient were reviewed by me and considered in my medical decision making (see chart for details).    MDM Rules/Calculators/A&P                          Initial impression-patient presents with a bug bite on his right ankle.  He is alert, does not appear acute stress, vital signs reassuring.  Patient is up-to-date on his tetanus shot we will update this.  Work-up-due to well-appearing patient, benign for exam, further ankle injury not want at this time.  Rule out- I have low suspicion for septic arthritis as patient denies IV drug use, skin exam was performed no erythematous, edematous, no new heart murmur heard on exam.  Low suspicion for DVT as he has no unilateral leg swelling, no tenderness along patient's calf. low Suspicion for overlying cellulitis or deep tissue infection like an abscess there is only a small amount of  erythema present on my exam, no fluctuance or induration present.  Low suspicion for compartment syndrome as area was palpated it was soft to the touch, neurovascular fully intact.   Plan-  Rash-suspect patient has a contact dermatitis from possible insect bite, will start him on topical steroid, recommend H1 H2 blockers, follow-up PCP as needed.  Will defer antibiotic treatment at this time as not immunocompromise, unlikely for cellulitis to  develop in less than 48 hours.  Vital signs have remained stable, no indication for hospital admission.   Patient given at home care as well strict return precautions.  Patient verbalized that they understood agreed to said plan.  Final Clinical Impression(s) / ED Diagnoses Final diagnoses:  Insect bite of right lower leg, initial encounter    Rx / DC Orders ED Discharge Orders          Ordered    triamcinolone cream (KENALOG) 0.1 %  2 times daily        12/17/20 1755             Carroll Sage, PA-C 12/17/20 1756    Long, Arlyss Repress, MD 12/20/20 [phone]

## 2020-12-17 NOTE — Discharge Instructions (Signed)
Suspect you are having a reaction from the bug bite, have given you a steroid cream please apply as prescribed.  Also recommend taking Claritin as well as Pepcid once a day for next 7 days.  Please keep the leg elevated when not use, apply ice to the area to help with the swelling.  Follow-up with your PCP as needed.  Come back to the emergency department if you develop chest pain, shortness of breath, severe abdominal pain, uncontrolled nausea, vomiting, diarrhea.

## 2022-05-08 ENCOUNTER — Other Ambulatory Visit: Payer: Self-pay

## 2022-05-08 ENCOUNTER — Emergency Department
Admission: EM | Admit: 2022-05-08 | Discharge: 2022-05-08 | Disposition: A | Discharge status: Home / Self Care | Payer: Medicaid Other | Attending: Emergency Medicine | Admitting: Emergency Medicine

## 2022-05-08 ENCOUNTER — Encounter: Payer: Self-pay | Admitting: *Deleted

## 2022-05-08 ENCOUNTER — Emergency Department: Payer: Medicaid Other

## 2022-05-08 DIAGNOSIS — Y92019 Unspecified place in single-family (private) house as the place of occurrence of the external cause: Secondary | ICD-10-CM | POA: Diagnosis not present

## 2022-05-08 DIAGNOSIS — S6991XA Unspecified injury of right wrist, hand and finger(s), initial encounter: Secondary | ICD-10-CM | POA: Diagnosis present

## 2022-05-08 DIAGNOSIS — I10 Essential (primary) hypertension: Secondary | ICD-10-CM | POA: Insufficient documentation

## 2022-05-08 DIAGNOSIS — S66911A Strain of unspecified muscle, fascia and tendon at wrist and hand level, right hand, initial encounter: Secondary | ICD-10-CM | POA: Diagnosis not present

## 2022-05-08 DIAGNOSIS — W010XXA Fall on same level from slipping, tripping and stumbling without subsequent striking against object, initial encounter: Secondary | ICD-10-CM | POA: Diagnosis not present

## 2022-05-08 MED ORDER — NAPROXEN 500 MG PO TABS
500.0000 mg | ORAL_TABLET | Freq: Once | ORAL | Status: AC
  Administered 2022-05-08: 500 mg via ORAL
  Filled 2022-05-08: qty 1

## 2022-05-08 NOTE — ED Notes (Signed)
Patient verbalizes understanding of discharge instructions. Opportunity for questioning and answers were provided. Armband removed by staff, pt discharged from ED. Ambulated out to lobby  

## 2022-05-08 NOTE — ED Triage Notes (Signed)
Pt has right hand pain.  Pt fell backwards after slipping in milk on the floor.  No swelling noted.  Pt alert

## 2022-05-08 NOTE — ED Provider Notes (Signed)
[facility] Provider Note    Event Date/Time   First MD Initiated Contact with Patient 05/08/22 2124     (approximate)   History   Hand Injury   HPI  Charles Brown is a 32 y.o. male with history of hypertension, anxiety, GERD, and as listed in the EMR presents to the emergency department for treatment and evaluation of right hand pain. He slipped in milk at home and fell backward, landing on his outstretched hand. No previous hand fracture.      Physical Exam   Triage Vital Signs: ED Triage Vitals  Enc Vitals Group     BP 05/08/22 2035 128/77     Pulse Rate 05/08/22 2035 90     Resp 05/08/22 2035 18     Temp 05/08/22 2035 98.2 F (36.8 C)     Temp Source 05/08/22 2035 Oral     SpO2 05/08/22 2035 95 %     Weight 05/08/22 2033 185 lb (83.9 kg)     Height 05/08/22 2033 5\' 10"  (1.778 m)     Head Circumference --      Peak Flow --      Pain Score 05/08/22 2033 5     Pain Loc --      Pain Edu? --      Excl. in [place]? --     Most recent vital signs: Vitals:   05/08/22 2035  BP: 128/77  Pulse: 90  Resp: 18  Temp: 98.2 F (36.8 C)  SpO2: 95%     General: Awake, no distress.  CV:  Good peripheral perfusion.  Resp:  Normal effort.  Abd:  No distention.  Other:  No deformity of the right wrist, hand, or fingers.  Normal capillary refill.  Motor and sensory function is intact.   ED Results / Procedures / Treatments   Labs (all labs ordered are listed, but only abnormal results are displayed) Labs Reviewed - No data to display   EKG  Not indicated   RADIOLOGY  Image of the right hand reviewed and interpreted by me.  No acute bony abnormality noted.   PROCEDURES:  Critical Care performed: No  Procedures   MEDICATIONS ORDERED IN ED: Medications  naproxen (NAPROSYN) tablet 500 mg (500 mg Oral Given 05/08/22 2259)     IMPRESSION / MDM / ASSESSMENT AND PLAN / ED COURSE  I reviewed the triage vital signs and the nursing  notes.                              Differential diagnosis includes, but is not limited to, hand sprain, fracture, wrist sprain, radius/ulnar fracture  Patient's presentation is most consistent with acute complicated illness / injury requiring diagnostic workup.  32 year old male presenting to the emergency department for treatment and evaluation after slipping and falling backward catching himself with his right hand.  Injury occurred at home.  Exam is reassuring.  X-ray is without concern for acute findings.  He will be placed in a brace for wrist/hand strain and advised to take Naprosyn or Tylenol for pain.  He was also encouraged to ice 20 minutes/h while awake.  He is to follow-up with orthopedics if not improving over the week.  He is to return to the emergency department for symptoms of change or worsen if he is unable to schedule an appointment.     FINAL CLINICAL IMPRESSION(S) / ED DIAGNOSES   Final  diagnoses:  Hand strain, right, initial encounter     Rx / DC Orders   ED Discharge Orders     None        Note:  This document was prepared using Dragon voice recognition software and may include unintentional dictation errors.   Victorino Dike, FNP 05/08/22 2321    Rada Hay, MD 05/09/22 Quentin Mulling
